# Patient Record
Sex: Male | Born: 1975 | Race: White | Hispanic: No | Marital: Married | Smoking: Never smoker
Health system: Southern US, Community
[De-identification: ages and names within clinical notes are randomized; demographics above are authoritative.]

## PROBLEM LIST (undated history)

## (undated) HISTORY — PX: HERNIA REPAIR: SHX51

---

## 2002-07-26 ENCOUNTER — Ambulatory Visit (HOSPITAL_COMMUNITY): Admission: RE | Admit: 2002-07-26 | Discharge: 2002-07-26 | Payer: Self-pay | Admitting: Family Medicine

## 2002-07-27 ENCOUNTER — Encounter: Payer: Self-pay | Admitting: Family Medicine

## 2014-04-21 ENCOUNTER — Other Ambulatory Visit: Payer: Self-pay | Admitting: Family Medicine

## 2014-04-21 ENCOUNTER — Telehealth: Payer: Self-pay | Admitting: Family Medicine

## 2014-04-21 NOTE — Telephone Encounter (Signed)
I did not see this patient according to chart and there is no record of visit.  I was not here on Wednesday that was my day off.

## 2014-04-22 NOTE — Telephone Encounter (Signed)
paTIENT COMING TO PICK UP rx

## 2014-05-04 ENCOUNTER — Ambulatory Visit (HOSPITAL_COMMUNITY)
Admission: RE | Admit: 2014-05-04 | Discharge: 2014-05-04 | Disposition: A | Discharge status: Home / Self Care | Payer: Self-pay | Source: Ambulatory Visit | Attending: Sports Medicine | Admitting: Sports Medicine

## 2014-05-04 ENCOUNTER — Other Ambulatory Visit (HOSPITAL_COMMUNITY): Payer: Self-pay | Admitting: Sports Medicine

## 2014-05-04 DIAGNOSIS — M25512 Pain in left shoulder: Secondary | ICD-10-CM

## 2014-05-04 DIAGNOSIS — Z77018 Contact with and (suspected) exposure to other hazardous metals: Secondary | ICD-10-CM | POA: Insufficient documentation

## 2017-02-27 ENCOUNTER — Encounter (HOSPITAL_COMMUNITY): Payer: Self-pay | Admitting: Emergency Medicine

## 2017-02-27 ENCOUNTER — Emergency Department (HOSPITAL_COMMUNITY)
Admission: EM | Admit: 2017-02-27 | Discharge: 2017-02-27 | Discharge status: Against Medical Advice | Payer: Self-pay | Attending: Internal Medicine | Admitting: Internal Medicine

## 2017-02-27 ENCOUNTER — Emergency Department (HOSPITAL_COMMUNITY): Payer: Self-pay

## 2017-02-27 DIAGNOSIS — R0602 Shortness of breath: Secondary | ICD-10-CM | POA: Insufficient documentation

## 2017-02-27 DIAGNOSIS — R110 Nausea: Secondary | ICD-10-CM | POA: Insufficient documentation

## 2017-02-27 DIAGNOSIS — R079 Chest pain, unspecified: Secondary | ICD-10-CM | POA: Insufficient documentation

## 2017-02-27 DIAGNOSIS — R064 Hyperventilation: Secondary | ICD-10-CM | POA: Insufficient documentation

## 2017-02-27 DIAGNOSIS — R61 Generalized hyperhidrosis: Secondary | ICD-10-CM | POA: Insufficient documentation

## 2017-02-27 LAB — CBC
HCT: 39.7 % (ref 39.0–52.0)
HEMOGLOBIN: 13.5 g/dL (ref 13.0–17.0)
MCH: 28.4 pg (ref 26.0–34.0)
MCHC: 34 g/dL (ref 30.0–36.0)
MCV: 83.4 fL (ref 78.0–100.0)
Platelets: 215 10*3/uL (ref 150–400)
RBC: 4.76 MIL/uL (ref 4.22–5.81)
RDW: 13.1 % (ref 11.5–15.5)
WBC: 6.2 10*3/uL (ref 4.0–10.5)

## 2017-02-27 LAB — BASIC METABOLIC PANEL
ANION GAP: 11 (ref 5–15)
BUN: 16 mg/dL (ref 6–20)
CO2: 22 mmol/L (ref 22–32)
Calcium: 8.9 mg/dL (ref 8.9–10.3)
Chloride: 104 mmol/L (ref 101–111)
Creatinine, Ser: 1.24 mg/dL (ref 0.61–1.24)
GFR calc non Af Amer: >60 mL/min (ref >60)
GLUCOSE: 123 mg/dL — AB (ref 65–99)
POTASSIUM: 3.4 mmol/L — AB (ref 3.5–5.1)
Sodium: 137 mmol/L (ref 135–145)

## 2017-02-27 LAB — I-STAT TROPONIN, ED: Troponin i, poc: 0 ng/mL (ref 0.00–0.08)

## 2017-02-27 LAB — CBG MONITORING, ED: Glucose-Capillary: 149 mg/dL — ABNORMAL HIGH (ref 65–99)

## 2017-02-27 MED ORDER — MORPHINE SULFATE (PF) 2 MG/ML IV SOLN: 2.0000 mg | INTRAVENOUS | Status: DC | PRN

## 2017-02-27 MED ORDER — ASPIRIN 81 MG PO CHEW
324.0000 mg | CHEWABLE_TABLET | Freq: Once | ORAL | Status: AC
  Administered 2017-02-27: 324 mg via ORAL
  Filled 2017-02-27: qty 4

## 2017-02-27 MED ORDER — ONDANSETRON HCL 4 MG/2ML IJ SOLN
4.0000 mg | INTRAMUSCULAR | Status: DC | PRN
  Administered 2017-02-27: 4 mg via INTRAVENOUS
  Filled 2017-02-27: qty 2

## 2017-02-27 MED ORDER — NITROGLYCERIN 0.4 MG SL SUBL
0.4000 mg | SUBLINGUAL_TABLET | SUBLINGUAL | Status: DC | PRN
  Filled 2017-02-27: qty 1

## 2017-02-27 NOTE — Discharge Instructions (Signed)
Take your usual prescriptions as previously directed. Call your regular medical doctor today to schedule a follow up appointment within the next 24 hours.  Return to the Emergency Department immediately sooner if worsening.

## 2017-02-27 NOTE — ED Triage Notes (Signed)
Pt c/o sudden onset cp/dizziness/sob/n x 1 hour ago.

## 2017-02-27 NOTE — ED Provider Notes (Signed)
[facility]PT Provider Note   CSN: [id number] Arrival date & time: 02/27/17  0813     History   Chief Complaint Chief Complaint  Patient presents with  . Chest Pain    HPI Victor Shah is a 41 y.o. male.  HPI  Pt was seen at 0815. Per pt and his spouse, c/o gradual onset and worsening of persistent chest "pain" over the past 1 hour. Describes the CP as "heaviness," has been associated with SOB, nausea and diaphoresis. Denies hx of similar symptoms. Denies palpitations, no abd pain, no vomiting/diarrhea, no back pain.   History reviewed. No pertinent past medical history. Pt has not seen a doctor "in at least 15 years."  There are no active problems to display for this patient.   Past Surgical History:  Procedure Laterality Date  . HERNIA REPAIR         Home Medications    Prior to Admission medications   Not on File    Family History No family history on file.  Social History Social History  Substance Use Topics  . Smoking status: Never Smoker  . Smokeless tobacco: Never Used  . Alcohol use No     Allergies   Patient has no known allergies.   Review of Systems Review of Systems ROS: Statement: All systems negative except as marked or noted in the HPI; Constitutional: Negative for fever and chills. ; ; Eyes: Negative for eye pain, redness and discharge. ; ; ENMT: Negative for ear pain, hoarseness, nasal congestion, sinus pressure and sore throat. ; ; Cardiovascular: +CP, SOB, diaphoresis. Negative for palpitations, and peripheral edema. ; ; Respiratory: Negative for cough, wheezing and stridor. ; ; Gastrointestinal: +nausea. Negative for vomiting, diarrhea, abdominal pain, blood in stool, hematemesis, jaundice and rectal bleeding. . ; ; Genitourinary: Negative for dysuria, flank pain and hematuria. ; ; Musculoskeletal: Negative for back pain and neck pain. Negative for swelling and trauma.; ; Skin: Negative for pruritus, rash, abrasions, blisters,  bruising and skin lesion.; ; Neuro: Negative for headache, lightheadedness and neck stiffness. Negative for weakness, altered level of consciousness, altered mental status, extremity weakness, paresthesias, involuntary movement, seizure and syncope.       Physical Exam Updated Vital Signs BP (!) 151/112   Pulse (!) 101   Resp 18   Ht 5\' 7"  (1.702 m)   Wt (!) 350 lb (158.8 kg)   SpO2 95%   BMI 54.82 kg/m    Patient Vitals for the past 24 hrs:  BP Pulse Resp SpO2 Height Weight  02/27/17 0831 (!) 151/112 (!) 101 18 95 % - -  02/27/17 0814 - - - - 5\' 7"  (1.702 m) (!) 350 lb (158.8 kg)  02/27/17 0813 121/90 (!) 121 - 99 % - -      Physical Exam 0820: Physical examination:  Nursing notes reviewed; Vital signs and O2 SAT reviewed;  Constitutional: Well developed, Well nourished, Well hydrated, Uncomfortable appearing.; Head:  Normocephalic, atraumatic; Eyes: EOMI, PERRL, No scleral icterus; ENMT: Mouth and pharynx normal, Mucous membranes moist; Neck: Supple, Full range of motion, No lymphadenopathy; Cardiovascular: Tachycardic rate and rhythm, No gallop; Respiratory: Breath sounds clear & equal bilaterally, No wheezes.  Speaking short sentences, Hyperventilating on my arrival to exam room; Chest: Nontender, Movement normal; Abdomen: Soft, Nontender, Nondistended, Normal bowel sounds; Genitourinary: No CVA tenderness; Extremities: Pulses normal, No tenderness, No edema, No calf edema or asymmetry.; Neuro: AA&Ox3, Major CN grossly intact.  Speech clear. No gross focal motor or sensory  deficits in extremities.; Skin: Color normal, Warm, Diaphoretic.; Psych:  Anxious.   ED Treatments / Results  Labs (all labs ordered are listed, but only abnormal results are displayed)   EKG  EKG Interpretation  Date/Time:  Friday February 27 2017 08:15:12 EDT Ventricular Rate:  118 PR Interval:    QRS Duration: 96 QT Interval:  320 QTC Calculation: 449 R Axis:   4 Text Interpretation:  Sinus  tachycardia Abnormal R-wave progression, early transition Borderline repolarization abnormality Borderline ST elevation, lateral leads No old tracing to compare Confirmed by [facility]  MD, Nicholos Johns [phone]) on 02/27/2017 8:23:53 AM       Radiology   Procedures Procedures (including critical care time)  Medications Ordered in ED Medications  aspirin chewable tablet 324 mg (not administered)  nitroGLYCERIN (NITROSTAT) SL tablet 0.4 mg (not administered)  ondansetron (ZOFRAN) injection 4 mg (not administered)  morphine 2 MG/ML injection 2 mg (not administered)     Initial Impression / Assessment and Plan / ED Course  I have reviewed the triage vital signs and the nursing notes.  Pertinent labs & imaging results that were available during my care of the patient were reviewed by me and considered in my medical decision making (see chart for details).  MDM Reviewed: previous chart, nursing note and vitals Interpretation: labs, ECG and x-ray   Results for orders placed or performed during the hospital encounter of 02/27/17  Basic metabolic panel  Result Value Ref Range   Sodium 137 135 - 145 mmol/L   Potassium 3.4 (L) 3.5 - 5.1 mmol/L   Chloride 104 101 - 111 mmol/L   CO2 22 22 - 32 mmol/L   Glucose, Bld 123 (H) 65 - 99 mg/dL   BUN 16 6 - 20 mg/dL   Creatinine, Ser 1.91 0.61 - 1.24 mg/dL   Calcium 8.9 8.9 - 47.8 mg/dL   GFR calc non Af Amer >60 >60 mL/min   GFR calc Af Amer >60 >60 mL/min   Anion gap 11 5 - 15  CBC  Result Value Ref Range   WBC 6.2 4.0 - 10.5 K/uL   RBC 4.76 4.22 - 5.81 MIL/uL   Hemoglobin 13.5 13.0 - 17.0 g/dL   HCT 29.5 62.1 - 30.8 %   MCV 83.4 78.0 - 100.0 fL   MCH 28.4 26.0 - 34.0 pg   MCHC 34.0 30.0 - 36.0 g/dL   RDW 65.7 84.6 - 96.2 %   Platelets 215 150 - 400 K/uL  I-stat troponin, ED  Result Value Ref Range   Troponin i, poc 0.00 0.00 - 0.08 ng/mL   Comment 3          CBG monitoring, ED  Result Value Ref Range   Glucose-Capillary 149 (H) 65 -  99 mg/dL   Dg Chest Port 1 View Result Date: 02/27/2017 CLINICAL DATA:  Mid chest pain EXAM: PORTABLE CHEST 1 VIEW COMPARISON:  None. FINDINGS: Cardiomegaly. Low lung volumes. No focal opacities, effusions or edema. No acute bony abnormality. IMPRESSION: Cardiomegaly.  No active disease. Electronically Signed   By: Charlett Nose M.D.   On: 02/27/2017 09:30    1010:  Pt hyperventilating on arrival to exam room; facemask without O2 applied with improvement. Pt has not seen a doctor in a significant amount of time; unknown medical hx. Will observation admit. T/C to [facility]. Conley Rolls, case discussed, including:  HPI, pertinent PM/SHx, VS/PE, dx testing, ED course and treatment:  Agreeable to come to ED for evaluation.  1020:  Pt now states that he does not want to stay for admission.   Pt and family informed re: dx testing results, including concern for symptoms today, and that I recommend observation admission for further evaluation.  Pt refuses admission.  I encouraged pt to stay, continues to refuse.  Pt makes his own medical decisions.  Risks of AMA explained to pt and family, including, but not limited to:  stroke, heart attack, cardiac arrythmia ("irregular heart rate/beat"), "passing out," temporary and/or permanent disability, death.  Pt and family verb understanding and continue to refuse admission, understanding the consequences of their decision.  I encouraged pt to follow up with his PMD today and return to the ED immediately if symptoms return, or for any other concerns.  Pt and family verb understanding, agreeable.   Final Clinical Impressions(s) / ED Diagnoses   Final diagnoses:  None    New Prescriptions New Prescriptions   No medications on file     Samuel JesterKathleen Yvette Roark, DO 03/01/17 1602

## 2017-02-27 NOTE — ED Notes (Signed)
Patient states that he does not want to stay.

## 2018-05-11 ENCOUNTER — Ambulatory Visit: Payer: PRIVATE HEALTH INSURANCE | Admitting: Family Medicine

## 2018-05-11 VITALS — BP 142/92 | Temp 98.0°F | Ht 67.0 in | Wt 336.0 lb

## 2018-05-11 DIAGNOSIS — M25522 Pain in left elbow: Secondary | ICD-10-CM | POA: Diagnosis not present

## 2018-05-11 MED ORDER — ETODOLAC 400 MG PO TABS: ORAL_TABLET | ORAL | 0 refills | Status: DC

## 2018-05-11 NOTE — Progress Notes (Signed)
   Subjective:    Patient ID: Victor Shah, male    DOB: 12/26/1975, 42 y.o.   MRN: [id number]  HPI Pt here today for left elbow pain. Going on for about a month. Pain radiates to hand. Having numbness. Has used OTC med and ice with no relief   Left latr elbow pain  Goes numb at times  Elbow hurts  Hand gets lighter colored.  Patient worried about blood flow to left arm.  Notes a wider coloration in the right arm.    Right handed   Works in [employer] a lot   Can occur day and ngiht     Noting some weknes in the hand, particularly exacerbated by elbow pain.   Lat elbow pain the worse     No major ijury   To the arm   40 hr weeks generally  Tylenol not helping  Patient has not been to the office for many years.  Blood pressure today elevated.  Patient's weight substantially up     Review of Systems No headache, no major weight loss or weight gain, no chest pain no back pain abdominal pain no change in bowel habits complete ROS otherwise negative     Objective:   Physical Exam Alert and oriented, vitals reviewed and stable, NAD ENT-TM's and ext canals WNL bilat via otoscopic exam Soft palate, tonsils and post pharynx WNL via oropharyngeal exam Neck-symmetric, no masses; thyroid nonpalpable and nontender Pulmonary-no tachypnea or accessory muscle use; Clear without wheezes via auscultation Card--no abnrml murmurs, rhythm reg and rate WNL Carotid pulses symmetric, without bruits Negative Phalen sign.  Negative Tinel sign.  Positive left elbow epicondyle tenderness to deep palpation.  Arterial pulses appear sufficient in radial and ulnar region.  Capillary refill intact.  There is a very slight differential left versus right in terms of overall coloration in the arm.  Very very slight differential.       Assessment & Plan:  1 probable epicondylitis.  Discussed.  Anti-inflammatory medicine may well help.  2.  Potential ulnar neuropathy.  May or may not  be related to #1.  Doubt cervical though always possible.  3.  Vascular concerns with normal capillary flow and pulses doubt substantial issue, however with patient's concerns and slight differential of overall color injection inside the side will obtain arterial studies.  May also be due to vascular effect from neuropathic process.  Plan trial of anti-inflammatory medicine.  Left elbow x-ray.  Left arm arterial studies.  Follow-up in a couple weeks with Dr. Lorin Picket.  Will need to reassess blood pressure and resume overall primary care

## 2018-05-17 ENCOUNTER — Ambulatory Visit (HOSPITAL_COMMUNITY)
Admission: RE | Admit: 2018-05-17 | Discharge: 2018-05-17 | Disposition: A | Discharge status: Home / Self Care | Payer: Self-pay | Source: Ambulatory Visit | Attending: Family Medicine | Admitting: Family Medicine

## 2018-05-17 DIAGNOSIS — M25522 Pain in left elbow: Secondary | ICD-10-CM | POA: Insufficient documentation

## 2018-05-27 ENCOUNTER — Ambulatory Visit: Payer: PRIVATE HEALTH INSURANCE | Admitting: Family Medicine

## 2018-05-27 ENCOUNTER — Encounter: Payer: Self-pay | Admitting: Family Medicine

## 2018-05-27 VITALS — BP 134/92 | Ht 67.0 in | Wt 338.5 lb

## 2018-05-27 DIAGNOSIS — G5692 Unspecified mononeuropathy of left upper limb: Secondary | ICD-10-CM | POA: Diagnosis not present

## 2018-05-27 DIAGNOSIS — M7712 Lateral epicondylitis, left elbow: Secondary | ICD-10-CM

## 2018-05-27 NOTE — Progress Notes (Signed)
   Subjective:    Patient ID: Delmer Islamhillip S Grassi, male    DOB: [birth year], 42 y.o.   MRN: [id number]  HPI Pt here today to follow up on elbow pain. Pt states he is having numbness and pain when not taking the med. Elbow has not became worse or better, has stayed about the same.  Significant elbow pain hurts with movement gripping relates intermittent numbness down into the hand denies shoulder pain.  Denies any injury but does do with that action  Review of Systems See above.    Objective:   Physical Exam Patient moderately overweight Shoulder normal wrist normal strength in hands good       Assessment & Plan:  I encourage patient if he has ongoing numbness or if he starts having weakness very important for the patient to be seen by hand specialist for evaluation of nerve conduction he does not want to do this currently  Lateral epicondylitis patient consents to an injection of steroids 0.5 mL's of Depo-Medrol was given along with 1 mL of lidocaine without epinephrine no complications

## 2018-07-21 ENCOUNTER — Encounter: Payer: Self-pay | Admitting: Family Medicine

## 2018-07-21 ENCOUNTER — Ambulatory Visit: Payer: PRIVATE HEALTH INSURANCE | Admitting: Family Medicine

## 2018-07-21 VITALS — BP 150/98 | Temp 98.5°F | Ht 67.0 in | Wt 324.0 lb

## 2018-07-21 DIAGNOSIS — K529 Noninfective gastroenteritis and colitis, unspecified: Secondary | ICD-10-CM

## 2018-07-21 MED ORDER — ONDANSETRON 4 MG PO TBDP
4.0000 mg | ORAL_TABLET | Freq: Three times a day (TID) | ORAL | 0 refills | Status: DC | PRN
Start: 2018-07-21 — End: 2022-01-10

## 2018-07-21 NOTE — Progress Notes (Signed)
   Subjective:    Patient ID: Victor Shah, male    DOB: 01/14/1976, 42 y.o.   MRN: [id number]  HPI  Patient is here today with complaints of nausea and vomiting and diarrhea with a headache since yesterday am around 4:00. He has not taken anything for this.  Hit fairly suddenly yest morn    vomitee numerous times ,   Pos multi dry heaves  And diarrhea   No fever  Worse when upright    Ok when stretched out   pts son had transietn g I prob last week    Ongoing challenges thsi rn    No meds otc     Review of Systems No headache, no major weight loss or weight gain, no chest pain no back pain abdominal pain no change in bowel habits complete ROS otherwise negative     Objective:   Physical Exam  Alert vitals stable, NAD. Blood pressure good on repeat. HEENT normal. Lungs clear. Heart regular rate and rhythm. Mild malaise noted abdomen hyperactive bowel sounds.  Diffuse mild tenderness.  No discrete tenderness      Assessment & Plan:  Impression gastroenteritis.  Likely viral.  Food poisoning always a possibility discussed.  Warning signs discussed diet discussed Zofran as needed diarrhea is already abating

## 2018-08-18 IMAGING — DX DG ELBOW COMPLETE 3+V*L*
4 series · 4 of 4 positions shown · non-contrast
Comparison: None.

CLINICAL DATA: Left elbow pain without known injury.

EXAM:
LEFT ELBOW - COMPLETE 3+ VIEW

[elbow ap]
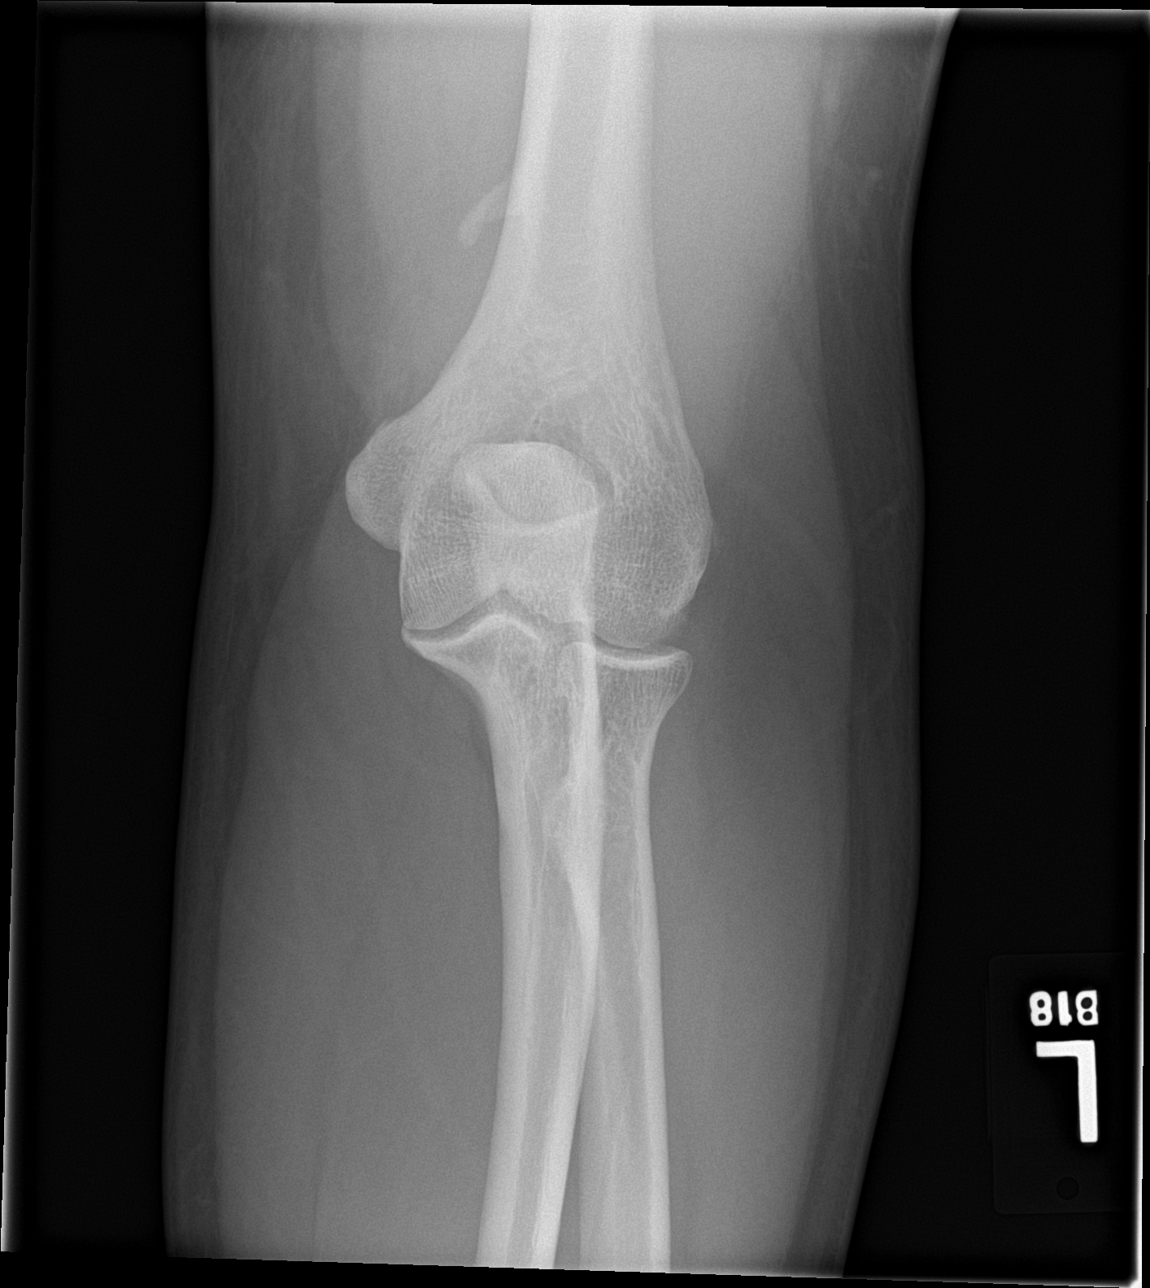

[elbow obl (1 of 2)]
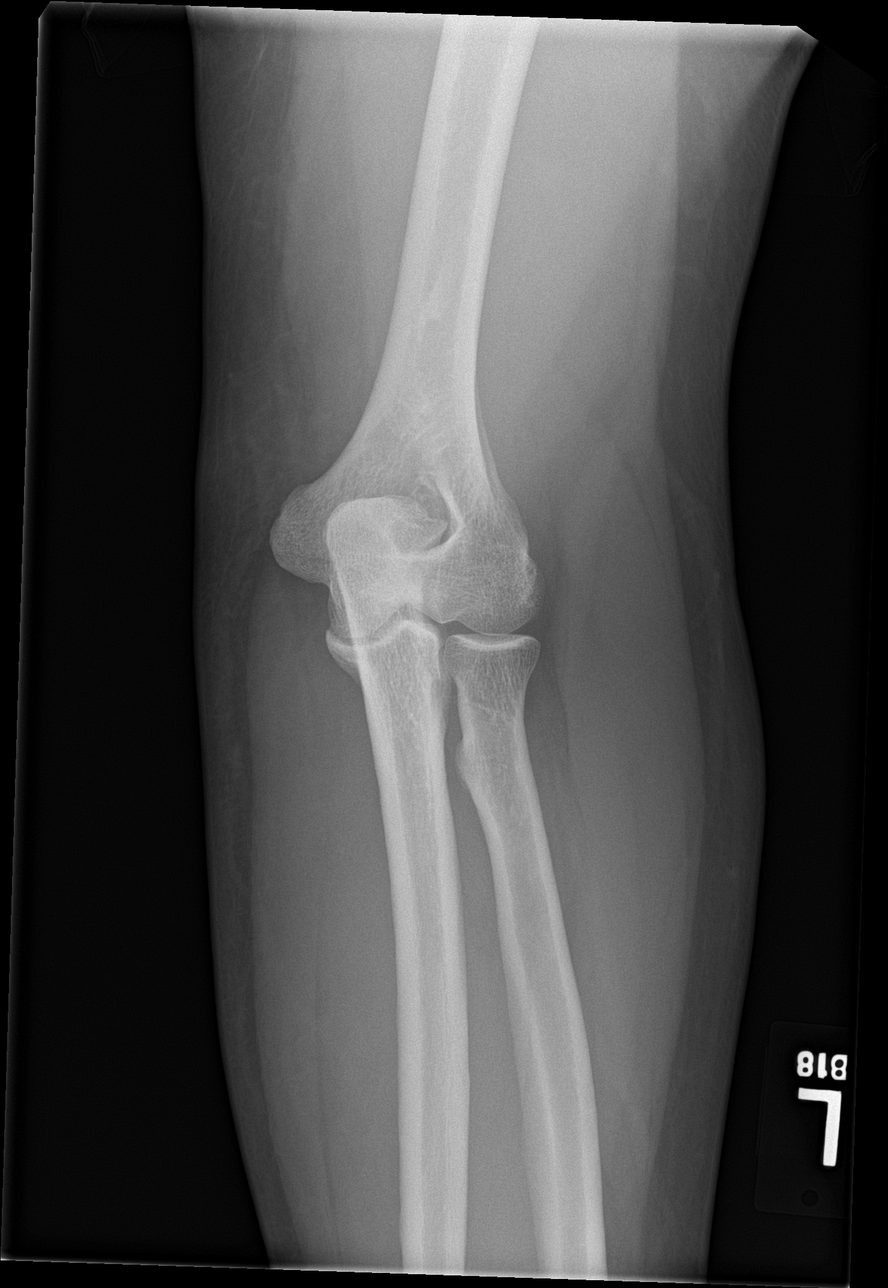

[elbow lat]
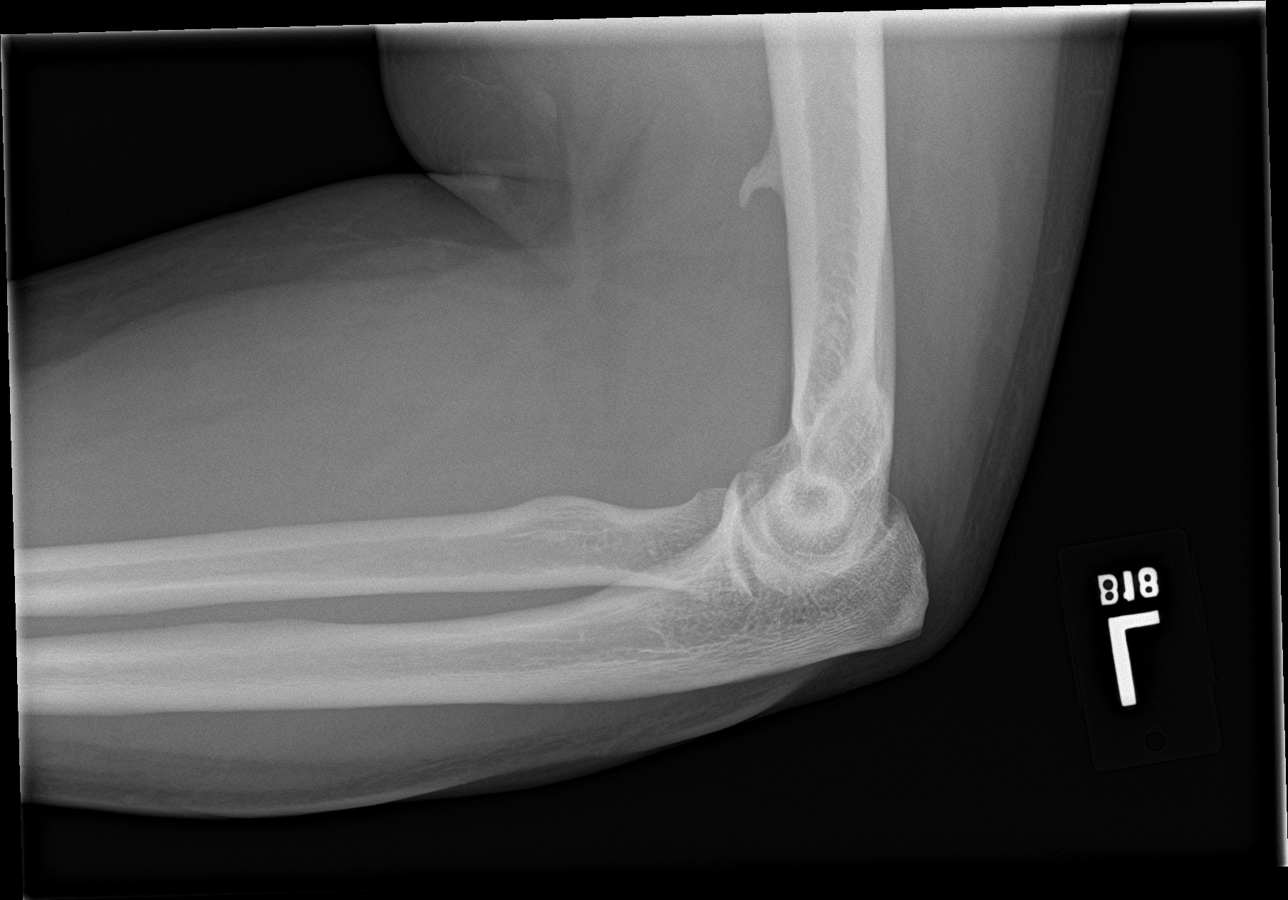

[elbow obl (2 of 2)]
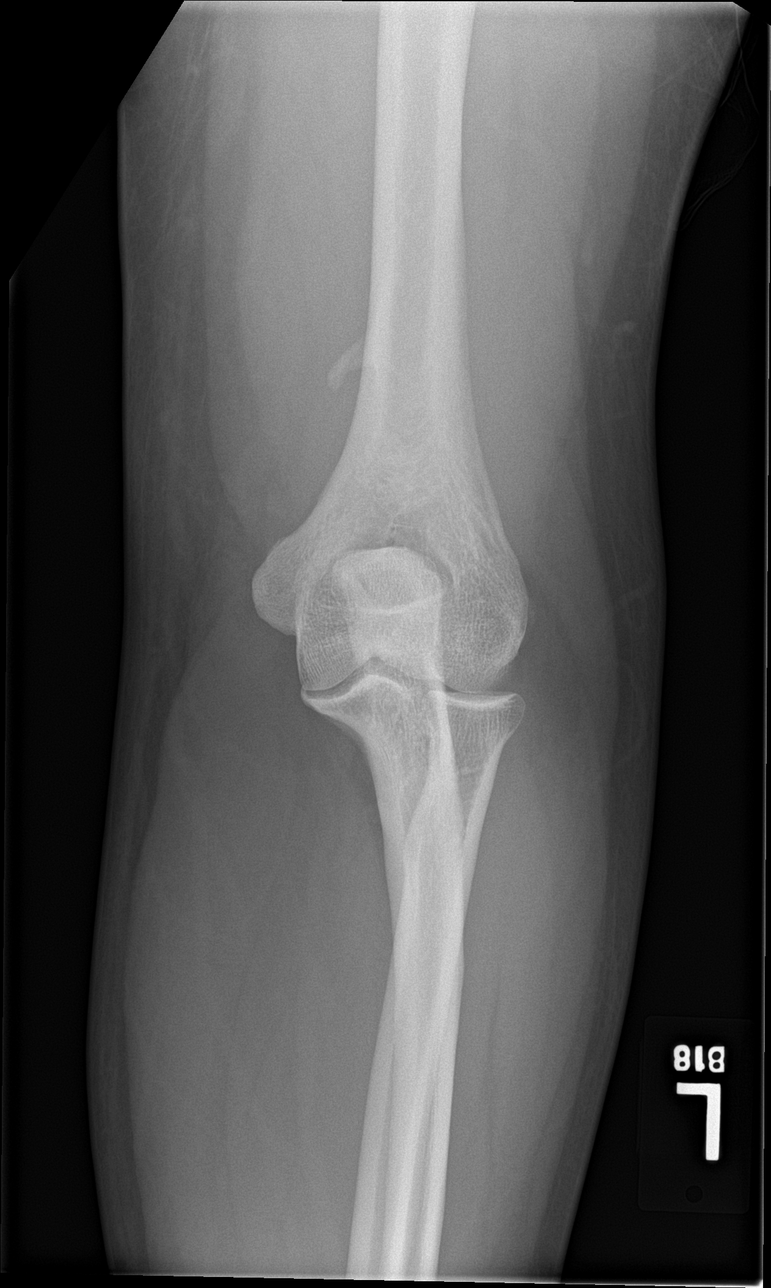

[4 of 4 positions shown; findings below may reference images not displayed]

FINDINGS: There is no evidence of fracture, dislocation, or joint effusion.
There is no evidence of arthropathy or other focal bone abnormality.
Soft tissues are unremarkable.
IMPRESSION: Normal left elbow.

## 2019-09-19 ENCOUNTER — Ambulatory Visit (INDEPENDENT_AMBULATORY_CARE_PROVIDER_SITE_OTHER): Payer: PRIVATE HEALTH INSURANCE | Admitting: Family Medicine

## 2019-09-19 DIAGNOSIS — Z20822 Contact with and (suspected) exposure to covid-19: Secondary | ICD-10-CM

## 2019-09-19 DIAGNOSIS — Z20828 Contact with and (suspected) exposure to other viral communicable diseases: Secondary | ICD-10-CM | POA: Diagnosis not present

## 2019-09-19 NOTE — Progress Notes (Signed)
   Subjective:    Patient ID: Victor Shah, male    DOB: [birth year], 43 y.o.   MRN: [id number]  HPIWoke up with body aches, then started to have a headache and nausea and about two hours later a cough started. Has not checked temp but he is sweating. Has not taken anything.  Patient states he is feeling okay late last week started having diarrhea some nausea and then on Sunday into Monday he started having some body aches sweats chills some coughing no shortness of breath no wheezing or difficulty breathing.  Energy level very low.  Able to eat and drink okay.  PMH benign patient does have morbid obesity Virtual Visit via Telephone Note  I connected with Victor Shah on 09/19/19 at  2:30 PM EDT by telephone and verified that I am speaking with the correct person using two identifiers.  Location: Patient: home Provider: office   I discussed the limitations, risks, security and privacy concerns of performing an evaluation and management service by telephone and the availability of in person appointments. I also discussed with the patient that there may be a patient responsible charge related to this service. The patient expressed understanding and agreed to proceed.   History of Present Illness:    Observations/Objective:   Assessment and Plan:   Follow Up Instructions:    I discussed the assessment and treatment plan with the patient. The patient was provided an opportunity to ask questions and all were answered. The patient agreed with the plan and demonstrated an understanding of the instructions.   The patient was advised to call back or seek an in-person evaluation if the symptoms worsen or if the condition fails to improve as anticipated.  I provided 15 minutes of non-face-to-face time during this encounter.      Review of Systems  Constitutional: Positive for chills and fatigue. Negative for activity change and fever.  HENT: Positive for congestion and  rhinorrhea. Negative for ear pain.   Eyes: Negative for discharge.  Respiratory: Positive for cough. Negative for shortness of breath and wheezing.   Cardiovascular: Negative for chest pain.  Gastrointestinal: Positive for diarrhea and nausea. Negative for vomiting.  Musculoskeletal: Negative for arthralgias.       Objective:   Physical Exam  Today's visit was via telephone Physical exam was not possible for this visit       Assessment & Plan:  Patient with suspected COVID Warning signs regarding respiratory distress and when to go to ER was reviewed with the patient It is advisable for the patient to stay out of work from this past Friday all the way through this coming Sunday return to work October 12 pending him feeling well enough to do so

## 2019-09-20 ENCOUNTER — Other Ambulatory Visit: Payer: Self-pay

## 2019-09-20 ENCOUNTER — Encounter: Payer: Self-pay | Admitting: Family Medicine

## 2019-09-20 DIAGNOSIS — Z20822 Contact with and (suspected) exposure to covid-19: Secondary | ICD-10-CM

## 2019-09-22 LAB — NOVEL CORONAVIRUS, NAA: SARS-CoV-2, NAA: NOT DETECTED

## 2022-01-10 ENCOUNTER — Other Ambulatory Visit: Payer: Self-pay

## 2022-01-10 ENCOUNTER — Ambulatory Visit: Payer: PRIVATE HEALTH INSURANCE | Admitting: Nurse Practitioner

## 2022-01-10 ENCOUNTER — Encounter: Payer: Self-pay | Admitting: Nurse Practitioner

## 2022-01-10 VITALS — BP 139/84 | HR 95 | Ht 67.0 in | Wt 397.2 lb

## 2022-01-10 DIAGNOSIS — Z13220 Encounter for screening for lipoid disorders: Secondary | ICD-10-CM

## 2022-01-10 DIAGNOSIS — G8929 Other chronic pain: Secondary | ICD-10-CM | POA: Insufficient documentation

## 2022-01-10 DIAGNOSIS — M25561 Pain in right knee: Secondary | ICD-10-CM | POA: Diagnosis not present

## 2022-01-10 DIAGNOSIS — I872 Venous insufficiency (chronic) (peripheral): Secondary | ICD-10-CM

## 2022-01-10 DIAGNOSIS — Z1329 Encounter for screening for other suspected endocrine disorder: Secondary | ICD-10-CM | POA: Diagnosis not present

## 2022-01-10 DIAGNOSIS — F419 Anxiety disorder, unspecified: Secondary | ICD-10-CM

## 2022-01-10 DIAGNOSIS — F32A Depression, unspecified: Secondary | ICD-10-CM

## 2022-01-10 MED ORDER — NAPROXEN 500 MG PO TABS: 500.0000 mg | ORAL_TABLET | Freq: Two times a day (BID) | ORAL | 0 refills | Status: DC

## 2022-01-10 MED ORDER — FLUOXETINE HCL 20 MG PO TABS: 20.0000 mg | ORAL_TABLET | Freq: Every day | ORAL | 0 refills | Status: DC

## 2022-01-10 NOTE — Progress Notes (Signed)
Subjective:    Patient ID: Victor Shah, male    DOB: [birth year], 46 y.o.   MRN: [id number]  HPI Wife present during visit.  Patient says having right knee pain for 3 weeks. Has noticed some swelling at times.  No history of injury.  Better with movement.  Worse when he is sitting or standing in 1 place.  Has a job that mainly requires him to stand in one place for long period of time.  Has been taking large doses of ibuprofen with slight relief. Patient is also requesting a routine lab work, has not had this done in years. Has been struggling with his weight and stress related to some family issues. Would like to restart Prozac. Was on this for years. Thinks it was at least 20 mg dose then.  Depression screen [facility] 2/9 01/10/2022  Decreased Interest 0  Down, Depressed, Hopeless 1  PHQ - 2 Score 1  Altered sleeping 2  Tired, decreased energy 3  Change in appetite 3  Feeling bad or failure about yourself  2  Trouble concentrating 0  Moving slowly or fidgety/restless 0  Suicidal thoughts 1  PHQ-9 Score 12  Difficult doing work/chores Extremely dIfficult   GAD 7 : Generalized Anxiety Score 01/10/2022  Nervous, Anxious, on Edge 2  Control/stop worrying 1  Worry too much - different things 2  Trouble relaxing 2  Restless 2  Easily annoyed or irritable 1  Afraid - awful might happen 2  Total GAD 7 Score 12  Anxiety Difficulty Extremely difficult      Review of Systems  Respiratory:  Negative for cough, chest tightness and shortness of breath.   Cardiovascular:  Positive for leg swelling. Negative for chest pain.  Musculoskeletal:  Positive for joint swelling.       Right knee pain      Objective:   Physical Exam NAD. Alert, oriented. Making good contact. Dressed appropriately. Mildly anxious affect. Lungs clear. Heart RRR. Lower extremities: significant venous stasis color changes bilaterally. Knee: minimal crepitus; normal ROM of the knee; generalized tenderness with  palpation anterior part of the knee and with pressure on patella. No popliteal edema or masses noted.  Today's Vitals   01/10/22 1330  BP: 139/84  Pulse: 95  SpO2: 98%  Weight: (!) 397 lb 3.2 oz (180.2 kg)  Height: 5\' 7"  (1.702 m)   Body mass index is 62.21 kg/m.       Assessment & Plan:   Problem List Items Addressed This Visit       Musculoskeletal and Integument   Venous stasis dermatitis of both lower extremities     Other   Acute pain of right knee - Primary   Relevant Orders   CBC with Differential   Comprehensive Metabolic Panel (CMET)   Anxiety and depression   Relevant Medications   FLUoxetine (PROZAC) 20 MG tablet   Morbid obesity (HCC)   Relevant Orders   CBC with Differential   Comprehensive Metabolic Panel (CMET)   Other Visit Diagnoses     Screening for lipid disorders       Relevant Orders   CBC with Differential   Lipid Profile   Comprehensive Metabolic Panel (CMET)   Screening for thyroid disorder       Relevant Orders   CBC with Differential   TSH   Comprehensive Metabolic Panel (CMET)        Defers xray of knee or orthopedic referral at this time.  Requests Naproxen for  his pain. Stop Ibuprofen use. Meds ordered this encounter  Medications   naproxen (NAPROSYN) 500 MG tablet    Sig: Take 1 tablet (500 mg total) by mouth 2 (two) times daily with a meal.    Dispense:  30 tablet    Refill:  0    Order Specific Question:   Supervising Provider    Answer:   Sallee Lange A [[id number]]   FLUoxetine (PROZAC) 20 MG tablet    Sig: Take 1 tablet (20 mg total) by mouth daily.    Dispense:  30 tablet    Refill:  0    Order Specific Question:   Supervising Provider    Answer:   Sallee Lange A [[id number]]   Restart Prozac as directed. Has taken this well in the past.  Discussed options for weight loss including bariatric surgery. He and his wife have joined a gym and plan to increase activity after work. Defers medication options at this point. Will  wait to see lab results. Further follow up based on this.  Return if knee symptoms worsen or fail to improve.

## 2022-01-18 LAB — LIPID PANEL
Chol/HDL Ratio: 4.1 ratio (ref 0.0–5.0)
Cholesterol, Total: 173 mg/dL (ref 100–199)
HDL: 42 mg/dL (ref >39)
LDL Chol Calc (NIH): 114 mg/dL — ABNORMAL HIGH (ref 0–99)
Triglycerides: 89 mg/dL (ref 0–149)
VLDL Cholesterol Cal: 17 mg/dL (ref 5–40)

## 2022-01-18 LAB — COMPREHENSIVE METABOLIC PANEL
ALT: 46 IU/L — ABNORMAL HIGH (ref 0–44)
AST: 35 IU/L (ref 0–40)
Albumin/Globulin Ratio: 1.6 (ref 1.2–2.2)
Albumin: 4.2 g/dL (ref 4.0–5.0)
Alkaline Phosphatase: 67 IU/L (ref 44–121)
BUN/Creatinine Ratio: 21 — ABNORMAL HIGH (ref 9–20)
BUN: 18 mg/dL (ref 6–24)
Bilirubin Total: 0.3 mg/dL (ref 0.0–1.2)
CO2: 25 mmol/L (ref 20–29)
Calcium: 8.8 mg/dL (ref 8.7–10.2)
Chloride: 103 mmol/L (ref 96–106)
Creatinine, Ser: 0.86 mg/dL (ref 0.76–1.27)
Globulin, Total: 2.6 g/dL (ref 1.5–4.5)
Glucose: 96 mg/dL (ref 70–99)
Potassium: 4.6 mmol/L (ref 3.5–5.2)
Sodium: 141 mmol/L (ref 134–144)
Total Protein: 6.8 g/dL (ref 6.0–8.5)
eGFR: 107 mL/min/{1.73_m2} (ref >59)

## 2022-01-18 LAB — CBC WITH DIFFERENTIAL/PLATELET
Basophils Absolute: 0 10*3/uL (ref 0.0–0.2)
Basos: 1 %
EOS (ABSOLUTE): 0.2 10*3/uL (ref 0.0–0.4)
Eos: 5 %
Hematocrit: 39.3 % (ref 37.5–51.0)
Hemoglobin: 12.8 g/dL — ABNORMAL LOW (ref 13.0–17.7)
Immature Grans (Abs): 0 10*3/uL (ref 0.0–0.1)
Immature Granulocytes: 0 %
Lymphocytes Absolute: 1.3 10*3/uL (ref 0.7–3.1)
Lymphs: 33 %
MCH: 27.6 pg (ref 26.6–33.0)
MCHC: 32.6 g/dL (ref 31.5–35.7)
MCV: 85 fL (ref 79–97)
Monocytes Absolute: 0.6 10*3/uL (ref 0.1–0.9)
Monocytes: 14 %
Neutrophils Absolute: 1.9 10*3/uL (ref 1.4–7.0)
Neutrophils: 47 %
Platelets: 204 10*3/uL (ref 150–450)
RBC: 4.63 x10E6/uL (ref 4.14–5.80)
RDW: 14.4 % (ref 11.6–15.4)
WBC: 4 10*3/uL (ref 3.4–10.8)

## 2022-01-18 LAB — TSH: TSH: 2.42 u[IU]/mL (ref 0.450–4.500)

## 2022-01-24 ENCOUNTER — Other Ambulatory Visit: Payer: Self-pay

## 2022-01-24 ENCOUNTER — Encounter: Payer: Self-pay | Admitting: Nurse Practitioner

## 2022-01-24 ENCOUNTER — Ambulatory Visit: Payer: PRIVATE HEALTH INSURANCE | Admitting: Nurse Practitioner

## 2022-01-24 VITALS — BP 124/80 | Temp 98.1°F | Wt >= 6400 oz

## 2022-01-24 DIAGNOSIS — M25561 Pain in right knee: Secondary | ICD-10-CM

## 2022-01-24 NOTE — Progress Notes (Signed)
° °  Subjective:    Patient ID: Victor Shah, male    DOB: 07-22-1976, 46 y.o.   MRN: [id number]  HPI Pt having issue with right knee. Pain has subsided. Pt states it feels like he has an ace bandage wrapped really tight around knee and when he walks he feels like his knee wants to go backward. Having some swelling. Pt taking Naprosyn which helped after a few days. 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  Pt states his weight has gone up since last time here and he began to drinking the 10 cal sparkling water and eating less but not losing weight.  Plans to start working out again once his knee is better. Would like to try healthy habits before considering weight loss surgery.       Objective:   Physical Exam NAD. Alert, oriented. Right knee: significant edema as compared to the left knee and last visit. No erythema or warmth. Moderate crepitus right; mild no left with ROM. Tenderness with palpation of the patella and anterior portion of the knee. No mass or popliteal tenderness.  Today's Vitals   01/24/22 0952  BP: 124/80  Temp: 98.1 F (36.7 C)  Weight: (!) 402 lb 6.4 oz (182.5 kg)   Body mass index is 63.02 kg/m.      Assessment & Plan:   Problem List Items Addressed This Visit       Other   Acute pain of right knee - Primary   Morbid obesity ([place])   Discussed options.  An appointment was made with ortho in one week for evaluation. Defers xray for now. Plans to get a larger knee brace since the one he obtained was too small. Continue Naproxen as directed for now.  Continue weight loss efforts.  Return if symptoms worsen or fail to improve.

## 2022-06-19 ENCOUNTER — Telehealth: Payer: Self-pay | Admitting: Family Medicine

## 2022-06-19 ENCOUNTER — Other Ambulatory Visit: Payer: Self-pay | Admitting: *Deleted

## 2022-06-19 DIAGNOSIS — Z0000 Encounter for general adult medical examination without abnormal findings: Secondary | ICD-10-CM

## 2022-06-19 NOTE — Telephone Encounter (Signed)
Last labs 01/17/22:  Lipid, CMP, TSH, CBC

## 2022-06-19 NOTE — Telephone Encounter (Signed)
Patient has physical in August and needing labs done. 

## 2022-06-19 NOTE — Telephone Encounter (Signed)
Patient says no family history of prostate cancer. Informed pt that labs are being ordered for his visit in August. Verbalized understanding. Labs ordered in epic

## 2022-06-19 NOTE — Telephone Encounter (Signed)
Recommend lipid, liver, metabolic 7, CBC per wellness Also if patient has family history or personal history of prostate cancer recommend PSA otherwise if no family history this starts at age 46

## 2022-06-23 ENCOUNTER — Telehealth: Payer: Self-pay | Admitting: Family Medicine

## 2022-06-23 DIAGNOSIS — E119 Type 2 diabetes mellitus without complications: Secondary | ICD-10-CM | POA: Diagnosis not present

## 2022-06-23 DIAGNOSIS — L03115 Cellulitis of right lower limb: Secondary | ICD-10-CM | POA: Diagnosis not present

## 2022-06-23 DIAGNOSIS — M7989 Other specified soft tissue disorders: Secondary | ICD-10-CM | POA: Diagnosis not present

## 2022-06-24 NOTE — Telephone Encounter (Signed)
Nurse- please close patient made appointment instead

## 2022-06-27 ENCOUNTER — Encounter: Payer: Self-pay | Admitting: Family Medicine

## 2022-06-27 ENCOUNTER — Ambulatory Visit (INDEPENDENT_AMBULATORY_CARE_PROVIDER_SITE_OTHER): Payer: BC Managed Care – PPO | Admitting: Family Medicine

## 2022-06-27 VITALS — BP 137/86 | Temp 98.2°F | Wt 393.6 lb

## 2022-06-27 DIAGNOSIS — L03115 Cellulitis of right lower limb: Secondary | ICD-10-CM | POA: Diagnosis not present

## 2022-06-27 MED ORDER — DOXYCYCLINE HYCLATE 100 MG PO TABS: 100.0000 mg | ORAL_TABLET | Freq: Two times a day (BID) | ORAL | 0 refills | Status: DC

## 2022-06-27 NOTE — Progress Notes (Addendum)
   Subjective:    Patient ID: Victor Shah, male    DOB: 1976-04-14, 46 y.o.   MRN: [id number]  HPI Pt arrives today to follow up on sore to right leg. Pt states leg swelled up and developed water blisters that became infected. Pt went to [facility] ER on 06/23/22. Pt has continued to keep leg wrapped since having it wrapped on Monday.  7/10-7/23 He relates an inflamed area on the leg that got blistered he went to the ER was put on Bactrim still has some tenderness around it.  Denies any other trouble.  Suffers with morbid obesity is trying to watch his weight he states his weight has come down some he is not interested in weight reduction surgery and unlikely his insurance covers [phone] Review of Systems     Objective:   Physical Exam  General-in no acute distress Eyes-no discharge Lungs-respiratory rate normal, CTA CV-no murmurs,RRR Extremities skin warm dry no edema Neuro grossly normal Behavior normal, alert  There is a erythematous area on the right lower leg no abscess noted a couple blistered areas that seem to be healing but is tender around     Assessment & Plan:  Pedal edema Cellulitis Morbid obesity Healthy diet regular activity Patient does not want to do surgery more than likely not going to get approved for [facility] but patient will check into it Add doxycycline twice daily If this area develops into an abscess or deep ulcer next step would be referral to wound management Follow-up if progressive troubles or problems

## 2022-08-06 ENCOUNTER — Encounter: Payer: Self-pay | Admitting: Family Medicine

## 2022-08-06 ENCOUNTER — Ambulatory Visit (INDEPENDENT_AMBULATORY_CARE_PROVIDER_SITE_OTHER): Payer: BC Managed Care – PPO | Admitting: Family Medicine

## 2022-08-06 VITALS — BP 154/74 | HR 93 | Temp 98.3°F | Ht 67.0 in | Wt 399.0 lb

## 2022-08-06 DIAGNOSIS — R739 Hyperglycemia, unspecified: Secondary | ICD-10-CM | POA: Diagnosis not present

## 2022-08-06 DIAGNOSIS — Z0000 Encounter for general adult medical examination without abnormal findings: Secondary | ICD-10-CM

## 2022-08-06 MED ORDER — AMLODIPINE BESYLATE 5 MG PO TABS: 5.0000 mg | ORAL_TABLET | Freq: Every day | ORAL | 1 refills | Status: DC
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
## 2023-07-31 ENCOUNTER — Ambulatory Visit: Payer: BC Managed Care – PPO | Admitting: Nurse Practitioner

## 2023-07-31 VITALS — BP 156/83 | HR 80 | Wt 396.0 lb

## 2023-07-31 DIAGNOSIS — M94261 Chondromalacia, right knee: Secondary | ICD-10-CM | POA: Diagnosis not present

## 2023-07-31 DIAGNOSIS — I10 Essential (primary) hypertension: Secondary | ICD-10-CM | POA: Diagnosis not present

## 2023-07-31 MED ORDER — NAPROXEN 500 MG PO TABS: 500.0000 mg | ORAL_TABLET | Freq: Two times a day (BID) | ORAL | 0 refills | Status: DC

## 2023-07-31 MED ORDER — TRAMADOL HCL 50 MG PO TABS: ORAL_TABLET | ORAL | 0 refills | Status: DC

## 2023-07-31 MED ORDER — AMLODIPINE BESYLATE 5 MG PO TABS: 5.0000 mg | ORAL_TABLET | Freq: Every day | ORAL | 1 refills | Status: DC

## 2023-07-31 NOTE — Progress Notes (Unsigned)
Subjective:    Patient ID: Victor Shah, male    DOB: 01-20-1976, 47 y.o.   MRN: [id number]  HPI Presents for complaints of a flareup of his right knee pain over the past 6 months.  Last seen by [facility] in early 2023.  Knee pain improved at that time and was doing well up until a few months ago.  No history of injury.  Working long hours at least 40 hours a week.  Stands on concrete.  Wears steel toed shoes.  Will use inserts to help with pain.  Has tried multiple methods to help with the pain.  Some improvement with OTC ibuprofen.  Did light duty the last time his knee pain flared up but cannot do that at this point for financial reasons.  Also cannot afford to go back to orthopedic specialist at this time again due to financial reasons.  States he needs to keep the pain under control until around February 2025 when financially he will be in a better position.  Has tried a knee brace but this limits his mobility to the point that it is of minimal use.  Has tried Ace wrap.  Has tried topicals.  The other medication that seems to help his pain when it severe is tramadol. Of note patient did not pick up his amlodipine 5 mg tablet as previously ordered.   Review of Systems  Respiratory:  Negative for cough, chest tightness and shortness of breath.   Cardiovascular:  Negative for chest pain.  Musculoskeletal:  Positive for joint swelling.       Right knee pain      07/31/2023    3:59 PM  Depression screen PHQ 2/9  Decreased Interest 0  Down, Depressed, Hopeless 0  PHQ - 2 Score 0  Altered sleeping 0  Tired, decreased energy 0  Change in appetite 0  Feeling bad or failure about yourself  0  Trouble concentrating 0  Moving slowly or fidgety/restless 0  Suicidal thoughts 0  PHQ-9 Score 0  Difficult doing work/chores Not difficult at all      07/31/2023    3:59 PM 01/10/2022    2:25 PM  GAD 7 : Generalized Anxiety Score  Nervous, Anxious, on Edge 0 2  Control/stop worrying 0 1   Worry too much - different things 0 2  Trouble relaxing 0 2  Restless 0 2  Easily annoyed or irritable 0 1  Afraid - awful might happen 0 2  Total GAD 7 Score 0 12  Anxiety Difficulty Not difficult at all Extremely difficult          Objective:   Physical Exam NAD.  Alert, oriented.  Lungs clear.  Heart regular rate rhythm.  Mild edema noted on the lateral aspect of the knee near the patella.  Tenderness with flexion and extension with significant crepitus noted.  No obvious joint laxity.  Gait slow but steady. See previous notes from [facility]. Today's Vitals   07/31/23 1554  BP: (!) 156/83  Pulse: 80  SpO2: 97%  Weight: (!) 396 lb (179.6 kg)   Body mass index is 62.02 kg/m.        Assessment & Plan:   Problem List Items Addressed This Visit       Cardiovascular and Mediastinum   Primary hypertension   Relevant Medications   amLODipine (NORVASC) 5 MG tablet     Musculoskeletal and Integument   Chondromalacia of right knee - Primary   Relevant Medications  naproxen (NAPROSYN) 500 MG tablet   traMADol (ULTRAM) 50 MG tablet      Start naproxen as directed with food.  Discontinue if any reflux symptoms.  Hold on other NSAIDs. Ice/heat applications to the knee area.  Recommend Ace wrap for support. Given prescription of tramadol to use sparingly for severe pain.  Note the patient states this does not make him drowsy and in fact has an opposite effect. Meds ordered this encounter  Medications   amLODipine (NORVASC) 5 MG tablet    Sig: Take 1 tablet (5 mg total) by mouth daily.    Dispense:  90 tablet    Refill:  1    Order Specific Question:   Supervising Provider    Answer:   Lilyan Punt A [[id number]]   naproxen (NAPROSYN) 500 MG tablet    Sig: Take 1 tablet (500 mg total) by mouth 2 (two) times daily with a meal.    Dispense:  60 tablet    Refill:  0    Order Specific Question:   Supervising Provider    Answer:   Lilyan Punt A [[id number]]   traMADol  (ULTRAM) 50 MG tablet    Sig: Take one tab po QID prn pain    Dispense:  30 tablet    Refill:  0    Order Specific Question:   Supervising Provider    Answer:   Lilyan Punt A [[id number]]  Start amlodipine 5 mg as previously ordered.  Monitor BP.  Recommend follow-up office visit for blood pressure and wellness exam.  Note that patient did not get his previous lab work that was ordered 08/06/2022. Encourage patient to continue to work on weight loss. Recommend follow-up with orthopedic specialist when this is feasible for patient.

## 2023-08-01 ENCOUNTER — Encounter: Payer: Self-pay | Admitting: Nurse Practitioner

## 2023-08-01 DIAGNOSIS — M94261 Chondromalacia, right knee: Secondary | ICD-10-CM | POA: Insufficient documentation

## 2023-08-01 DIAGNOSIS — I10 Essential (primary) hypertension: Secondary | ICD-10-CM | POA: Insufficient documentation

## 2023-09-14 ENCOUNTER — Telehealth: Payer: Self-pay | Admitting: Family Medicine

## 2023-09-14 NOTE — Telephone Encounter (Signed)
Refill on    naproxen (NAPROSYN) 500 MG tablet  traMADol (ULTRAM) 50 MG tablet  send to  [facility]

## 2023-09-17 ENCOUNTER — Other Ambulatory Visit: Payer: Self-pay | Admitting: Family Medicine

## 2023-09-17 MED ORDER — NAPROXEN 500 MG PO TABS: 500.0000 mg | ORAL_TABLET | Freq: Two times a day (BID) | ORAL | 0 refills | Status: DC

## 2023-09-17 MED ORDER — TRAMADOL HCL 50 MG PO TABS: ORAL_TABLET | ORAL | 0 refills | Status: DC

## 2023-09-17 NOTE — Telephone Encounter (Signed)
Patient called and no voice mail set up. (Nurse Note* patient's medication has been sent to the pharmacy)

## 2023-09-17 NOTE — Telephone Encounter (Signed)
Prescription was sent in tramadol has limitations regarding the quantity I can send in if he feels like he needs tramadol long-term we would need to establish that as a pain management contract because it is technically considered a controlled medicine if any ongoing troubles follow-up thank you

## 2023-11-06 ENCOUNTER — Encounter: Payer: BC Managed Care – PPO | Admitting: Nurse Practitioner

## 2023-11-06 ENCOUNTER — Encounter: Payer: Self-pay | Admitting: Nurse Practitioner

## 2023-11-06 ENCOUNTER — Ambulatory Visit (INDEPENDENT_AMBULATORY_CARE_PROVIDER_SITE_OTHER): Payer: BC Managed Care – PPO | Admitting: Nurse Practitioner

## 2023-11-06 VITALS — BP 172/82 | HR 82 | Temp 97.7°F | Ht 67.0 in | Wt 389.0 lb

## 2023-11-06 DIAGNOSIS — Z0001 Encounter for general adult medical examination with abnormal findings: Secondary | ICD-10-CM | POA: Diagnosis not present

## 2023-11-06 DIAGNOSIS — M25561 Pain in right knee: Secondary | ICD-10-CM | POA: Diagnosis not present

## 2023-11-06 DIAGNOSIS — Z0000 Encounter for general adult medical examination without abnormal findings: Secondary | ICD-10-CM

## 2023-11-06 DIAGNOSIS — M25562 Pain in left knee: Secondary | ICD-10-CM | POA: Diagnosis not present

## 2023-11-06 DIAGNOSIS — I10 Essential (primary) hypertension: Secondary | ICD-10-CM

## 2023-11-06 DIAGNOSIS — G8929 Other chronic pain: Secondary | ICD-10-CM

## 2023-11-06 DIAGNOSIS — Z1211 Encounter for screening for malignant neoplasm of colon: Secondary | ICD-10-CM

## 2023-11-06 MED ORDER — NAPROXEN 500 MG PO TABS: 500.0000 mg | ORAL_TABLET | Freq: Two times a day (BID) | ORAL | 2 refills | Status: AC

## 2023-11-06 MED ORDER — AMLODIPINE BESYLATE 5 MG PO TABS: 5.0000 mg | ORAL_TABLET | Freq: Every day | ORAL | 0 refills | Status: AC

## 2023-11-06 MED ORDER — TRAMADOL HCL 50 MG PO TABS: ORAL_TABLET | ORAL | 0 refills | Status: AC
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
## 2024-05-06 ENCOUNTER — Ambulatory Visit: Payer: BC Managed Care – PPO | Admitting: Nurse Practitioner
